# Patient Record
Sex: Female | Born: 1960 | Hispanic: No | Marital: Married | Smoking: Never smoker
Health system: Southern US, Community
[De-identification: ages and names within clinical notes are randomized; demographics above are authoritative.]

## PROBLEM LIST (undated history)

## (undated) DIAGNOSIS — E119 Type 2 diabetes mellitus without complications: Secondary | ICD-10-CM

## (undated) DIAGNOSIS — H269 Unspecified cataract: Secondary | ICD-10-CM

## (undated) DIAGNOSIS — R87619 Unspecified abnormal cytological findings in specimens from cervix uteri: Secondary | ICD-10-CM

## (undated) HISTORY — DX: Unspecified abnormal cytological findings in specimens from cervix uteri: R87.619

## (undated) HISTORY — PX: EYE SURGERY: SHX253

## (undated) HISTORY — DX: Type 2 diabetes mellitus without complications: E11.9

## (undated) HISTORY — DX: Unspecified cataract: H26.9

---

## 2010-12-09 ENCOUNTER — Other Ambulatory Visit: Payer: Self-pay | Admitting: Internal Medicine

## 2010-12-09 ENCOUNTER — Ambulatory Visit: Payer: Self-pay | Admitting: Internal Medicine

## 2010-12-09 LAB — COMPREHENSIVE METABOLIC PANEL
ALT: 45 U/L — ABNORMAL HIGH (ref 0–35)
AST: 27 U/L (ref 0–37)
Albumin: 4.9 g/dL (ref 3.5–5.2)
Alkaline Phosphatase: 100 U/L (ref 39–117)
BUN: 13 mg/dL (ref 6–23)
Creatinine, Ser: 0.72 mg/dL (ref 0.40–1.20)
Potassium: 3.8 mEq/L (ref 3.5–5.3)

## 2010-12-09 LAB — CBC WITH DIFFERENTIAL/PLATELET
BASO%: 0.5 % (ref 0.0–2.0)
MCHC: 35.1 g/dL (ref 31.5–36.0)
MONO#: 0.5 10*3/uL (ref 0.1–0.9)
RBC: 4.66 10*6/uL (ref 3.70–5.45)
WBC: 5.2 10*3/uL (ref 3.9–10.3)
lymph#: 1.5 10*3/uL (ref 0.9–3.3)

## 2010-12-09 LAB — IRON AND TIBC
%SAT: 33 % (ref 20–55)
Iron: 117 ug/dL (ref 42–145)
UIBC: 233 ug/dL

## 2012-12-14 ENCOUNTER — Encounter: Payer: BC Managed Care – PPO | Attending: Family Medicine | Admitting: *Deleted

## 2012-12-14 ENCOUNTER — Encounter: Payer: Self-pay | Admitting: *Deleted

## 2012-12-14 DIAGNOSIS — Z713 Dietary counseling and surveillance: Secondary | ICD-10-CM | POA: Insufficient documentation

## 2012-12-14 DIAGNOSIS — E119 Type 2 diabetes mellitus without complications: Secondary | ICD-10-CM | POA: Insufficient documentation

## 2012-12-14 NOTE — Patient Instructions (Signed)
Plan:  Aim for 2-3 Carb Choices per meal (30-45 grams)  Aim for 0-1 Carbs per snack if hungry  Consider reading food labels for Total Carbohydrate of foods Continue with your current activity level by going to gym or playing outside for 30 minutes daily as tolerated Continue checking BG at alternate times per day as directed by MD

## 2012-12-14 NOTE — Progress Notes (Signed)
  Medical Nutrition Therapy:  Appt start time: 1530 end time:  1630.  Assessment:  Primary concerns today: patient here for newly diagnosed diabetes. 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 Lives with husband, both shop and she cooks most meals. Strong family history of Diabetes. SMBG now 4 times a day, range from 95-130. Going to gym 5-6 times a week now since diagnosed. Very sad about diagnosis, doesn't want the diabetes to monopolize her life.  MEDICATIONS: see list. Invokana   DIETARY INTAKE:  Usual eating pattern includes 3 meals and 2-3 snacks per day.  Everyday foods include good variety of all food groups.  Avoided foods include fried or high calorie foods now.    24-hr recall:  B ( AM): coffee and toast OR smoothie with fruit and protein powder, almond milk OR 2 toast, egg, coffee with non dairy creamer Snk ( AM): none  L (10 APM): carrots, yogurt and fresh fruit Snk (s PM): tuna on a tortilla, sprouts and lettuce D ( PM): lean meat, no starch or small portion lately, vegetables, water Snk ( PM): 3 crackers with natural PNB,  Beverages: coffee, water  Usual physical activity: going to gym now 5-6 times a week. Had stopped after daughter was married this past summer.  Estimated energy needs: 1200 calories 135 g carbohydrates 90 g protein 33 g fat  Progress Towards Goal(s):  In progress.   Nutritional Diagnosis:  NB-1.1 Food and nutrition-related knowledge deficit As related to new diagnosis of diabetes.  As evidenced by A1c of 11.2% when diagnosed in early December, 2013.    Intervention:  Nutrition counseling and diabetes education initiated. Discussed basic physiology of diabetes, SMBG and rationale of checking BG at alternate times of day, A1c, Carb Counting and reading food labels, and benefits of increased activity. Also spent time explaining the diagnosis was not her fault and that she has nothing to be ashamed of. Shared stories of previous patients who  started taking better care of themselves once they were diagnosed and ended up living much better in the long run. Plan:  Aim for 2-3 Carb Choices per meal (30-45 grams)  Aim for 0-1 Carbs per snack if hungry  Consider reading food labels for Total Carbohydrate of foods Continue with your current activity level by going to gym or playing outside for 30 minutes daily as tolerated Continue checking BG at alternate times per day as directed by MD  Handouts given during visit include: Living Well with Diabetes Carb Counting and Food Label handouts Meal Plan Card  Diabetes Medication List  Monitoring/Evaluation:  Dietary intake, exercise, reading food labels, and body weight in 6 week(s).

## 2013-01-27 ENCOUNTER — Ambulatory Visit: Payer: BC Managed Care – PPO | Admitting: *Deleted

## 2013-09-13 ENCOUNTER — Encounter (INDEPENDENT_AMBULATORY_CARE_PROVIDER_SITE_OTHER): Payer: BC Managed Care – PPO | Admitting: Ophthalmology

## 2013-09-13 DIAGNOSIS — H2510 Age-related nuclear cataract, unspecified eye: Secondary | ICD-10-CM

## 2013-09-13 DIAGNOSIS — H43819 Vitreous degeneration, unspecified eye: Secondary | ICD-10-CM

## 2013-09-13 DIAGNOSIS — E11319 Type 2 diabetes mellitus with unspecified diabetic retinopathy without macular edema: Secondary | ICD-10-CM

## 2013-09-13 DIAGNOSIS — E1139 Type 2 diabetes mellitus with other diabetic ophthalmic complication: Secondary | ICD-10-CM

## 2013-09-14 ENCOUNTER — Encounter (INDEPENDENT_AMBULATORY_CARE_PROVIDER_SITE_OTHER): Payer: Self-pay | Admitting: Ophthalmology

## 2014-01-02 ENCOUNTER — Encounter (INDEPENDENT_AMBULATORY_CARE_PROVIDER_SITE_OTHER): Payer: BC Managed Care – PPO | Admitting: Ophthalmology

## 2014-01-02 DIAGNOSIS — H43819 Vitreous degeneration, unspecified eye: Secondary | ICD-10-CM

## 2014-01-02 DIAGNOSIS — H35439 Paving stone degeneration of retina, unspecified eye: Secondary | ICD-10-CM

## 2014-01-02 DIAGNOSIS — E1165 Type 2 diabetes mellitus with hyperglycemia: Secondary | ICD-10-CM

## 2014-01-02 DIAGNOSIS — E11319 Type 2 diabetes mellitus with unspecified diabetic retinopathy without macular edema: Secondary | ICD-10-CM

## 2014-01-02 DIAGNOSIS — E1139 Type 2 diabetes mellitus with other diabetic ophthalmic complication: Secondary | ICD-10-CM

## 2014-01-09 ENCOUNTER — Encounter: Payer: Self-pay | Admitting: Obstetrics & Gynecology

## 2014-01-09 ENCOUNTER — Ambulatory Visit (INDEPENDENT_AMBULATORY_CARE_PROVIDER_SITE_OTHER): Payer: BC Managed Care – PPO | Admitting: Obstetrics & Gynecology

## 2014-01-09 VITALS — BP 156/96 | HR 101 | Temp 98.5°F | Ht 63.0 in | Wt 151.7 lb

## 2014-01-09 DIAGNOSIS — Z0000 Encounter for general adult medical examination without abnormal findings: Secondary | ICD-10-CM

## 2014-01-09 DIAGNOSIS — N952 Postmenopausal atrophic vaginitis: Secondary | ICD-10-CM

## 2014-01-09 MED ORDER — ESTRADIOL 10 MCG VA TABS: 1.0000 | ORAL_TABLET | Freq: Every day | VAGINAL | Status: DC

## 2014-01-09 NOTE — Progress Notes (Signed)
   Subjective:    Patient ID: Sharon Lozano, female    DOB: 05-Aug-1961, 53 y.o.   MRN: [id number]  HPI This 53 yo MW G3 P3 is here today with the complaint of dyspareunia due to vaginal atrophy. She had sudden cessation of her period at age 53. She reports only an occasional hot flash.   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    Objective:   Physical Exam  Moderate to severe atrophy Normal bimanual exam      Assessment & Plan:  Preventative care- pap done today, schedule mammogram Vaginal atrophy- vagifem tablets, astroglide prn

## 2014-02-14 ENCOUNTER — Other Ambulatory Visit: Payer: Self-pay | Admitting: Obstetrics & Gynecology

## 2014-02-14 ENCOUNTER — Ambulatory Visit (HOSPITAL_COMMUNITY)
Admission: RE | Admit: 2014-02-14 | Discharge: 2014-02-14 | Disposition: A | Discharge status: Home / Self Care | Payer: BC Managed Care – PPO | Source: Ambulatory Visit | Attending: Obstetrics & Gynecology | Admitting: Obstetrics & Gynecology

## 2014-02-14 DIAGNOSIS — Z0000 Encounter for general adult medical examination without abnormal findings: Secondary | ICD-10-CM

## 2014-02-14 DIAGNOSIS — Z1231 Encounter for screening mammogram for malignant neoplasm of breast: Secondary | ICD-10-CM

## 2015-01-31 ENCOUNTER — Other Ambulatory Visit: Payer: Self-pay | Admitting: Obstetrics & Gynecology

## 2016-06-09 ENCOUNTER — Other Ambulatory Visit: Payer: Self-pay | Admitting: Obstetrics & Gynecology

## 2016-09-23 ENCOUNTER — Ambulatory Visit: Payer: BC Managed Care – PPO | Admitting: Obstetrics & Gynecology

## 2016-10-29 ENCOUNTER — Ambulatory Visit (INDEPENDENT_AMBULATORY_CARE_PROVIDER_SITE_OTHER): Payer: BC Managed Care – PPO | Admitting: Obstetrics & Gynecology

## 2016-10-29 ENCOUNTER — Encounter: Payer: Self-pay | Admitting: Obstetrics & Gynecology

## 2016-10-29 VITALS — BP 139/85 | HR 88 | Ht 62.0 in | Wt 145.0 lb

## 2016-10-29 DIAGNOSIS — Z124 Encounter for screening for malignant neoplasm of cervix: Secondary | ICD-10-CM

## 2016-10-29 DIAGNOSIS — Z1151 Encounter for screening for human papillomavirus (HPV): Secondary | ICD-10-CM | POA: Diagnosis not present

## 2016-10-29 DIAGNOSIS — Z01419 Encounter for gynecological examination (general) (routine) without abnormal findings: Secondary | ICD-10-CM | POA: Diagnosis not present

## 2016-10-29 DIAGNOSIS — Z0000 Encounter for general adult medical examination without abnormal findings: Secondary | ICD-10-CM

## 2016-10-29 MED ORDER — ESTRADIOL 10 MCG VA TABS: ORAL_TABLET | VAGINAL | 16 refills | Status: DC

## 2016-10-29 NOTE — Progress Notes (Signed)
Subjective:    Mar DaringSusan P Gittins is a 55 y.o. MW P3 (31, 6626, and 57 yo kids, no grands) female who presents for an annual exam. The patient has no complaints today. She needs a refill of her Vagifem. The patient is sexually active. GYN screening history: last pap: was normal. The patient wears seatbelts: yes. The patient participates in regular exercise: yes. Has the patient ever been transfused or tattooed?: no. The patient reports that there is not domestic violence in her life.   Menstrual History: OB History    No data available      Menarche age: [age] No LMP recorded. Patient is postmenopausal.    The following portions of the patient's history were reviewed and updated as appropriate: allergies, current medications, past family history, past medical history, past social history, past surgical history and problem list.  Review of Systems Pertinent items are noted in HPI.   Married for 36 years Needs mammogram Had colonoscopy 2017 Had flu vaccine this seaon FH- no breast/gyn/colon cancer [facility] [place]   Objective:    BP 139/85   Pulse 88   Ht 5\' 2"  (1.575 m)   Wt 145 lb (65.8 kg)   BMI 26.52 kg/m   General Appearance:    Alert, cooperative, no distress, appears stated age  Head:    Normocephalic, without obvious abnormality, atraumatic  Eyes:    PERRL, conjunctiva/corneas clear, EOM's intact, fundi    benign, both eyes  Ears:    Normal TM's and external ear canals, both ears  Nose:   Nares normal, septum midline, mucosa normal, no drainage    or sinus tenderness  Throat:   Lips, mucosa, and tongue normal; teeth and gums normal  Neck:   Supple, symmetrical, trachea midline, no adenopathy;    thyroid:  no enlargement/tenderness/nodules; no carotid   bruit or JVD  Back:     Symmetric, no curvature, ROM normal, no CVA tenderness  Lungs:     Clear to auscultation bilaterally, respirations unlabored  Chest Wall:    No tenderness or deformity   Heart:    Regular  rate and rhythm, S1 and S2 normal, no murmur, rub   or gallop  Breast Exam:    No tenderness, masses, or nipple abnormality  Abdomen:     Soft, non-tender, bowel sounds active all four quadrants,    no masses, no organomegaly  Genitalia:    Normal female without lesion, discharge or tenderness     Extremities:   Extremities normal, atraumatic, no cyanosis or edema  Pulses:   2+ and symmetric all extremities  Skin:   Skin color, texture, turgor normal, no rashes or lesions  Lymph nodes:   Cervical, supraclavicular, and axillary nodes normal  Neurologic:   CNII-XII intact, normal strength, sensation and reflexes    throughout   .    Assessment:    Healthy female exam.    Plan:     Thin prep Pap smear. with cotesting Mammogram Refill of vagifem She may have refills forever.

## 2016-10-30 LAB — CYTOLOGY - PAP
Diagnosis: NEGATIVE
HPV: NOT DETECTED

## 2016-12-12 ENCOUNTER — Other Ambulatory Visit: Payer: Self-pay | Admitting: Obstetrics & Gynecology

## 2017-11-02 ENCOUNTER — Other Ambulatory Visit: Payer: Self-pay | Admitting: Obstetrics & Gynecology

## 2019-04-27 ENCOUNTER — Other Ambulatory Visit: Payer: Self-pay | Admitting: Nurse Practitioner

## 2019-04-27 DIAGNOSIS — Z1231 Encounter for screening mammogram for malignant neoplasm of breast: Secondary | ICD-10-CM

## 2019-05-23 ENCOUNTER — Other Ambulatory Visit: Payer: Self-pay

## 2019-05-23 ENCOUNTER — Ambulatory Visit
Admission: RE | Admit: 2019-05-23 | Discharge: 2019-05-23 | Disposition: A | Discharge status: Home / Self Care | Payer: BC Managed Care – PPO | Source: Ambulatory Visit | Attending: Nurse Practitioner | Admitting: Nurse Practitioner

## 2019-05-23 DIAGNOSIS — Z1231 Encounter for screening mammogram for malignant neoplasm of breast: Secondary | ICD-10-CM

## 2021-08-26 ENCOUNTER — Other Ambulatory Visit: Payer: Self-pay | Admitting: Nurse Practitioner

## 2021-08-26 DIAGNOSIS — Z1231 Encounter for screening mammogram for malignant neoplasm of breast: Secondary | ICD-10-CM

## 2021-09-24 ENCOUNTER — Other Ambulatory Visit: Payer: Self-pay

## 2021-09-24 ENCOUNTER — Ambulatory Visit
Admission: RE | Admit: 2021-09-24 | Discharge: 2021-09-24 | Disposition: A | Discharge status: Home / Self Care | Payer: BC Managed Care – PPO | Source: Ambulatory Visit | Attending: Nurse Practitioner | Admitting: Nurse Practitioner

## 2021-09-24 DIAGNOSIS — Z1231 Encounter for screening mammogram for malignant neoplasm of breast: Secondary | ICD-10-CM

## 2022-03-24 ENCOUNTER — Other Ambulatory Visit: Payer: Self-pay | Admitting: Nurse Practitioner

## 2022-03-24 DIAGNOSIS — R5381 Other malaise: Secondary | ICD-10-CM

## 2022-04-14 ENCOUNTER — Other Ambulatory Visit: Payer: Self-pay | Admitting: Nurse Practitioner

## 2022-04-14 DIAGNOSIS — Z780 Asymptomatic menopausal state: Secondary | ICD-10-CM

## 2022-04-14 DIAGNOSIS — Z8262 Family history of osteoporosis: Secondary | ICD-10-CM

## 2022-04-30 ENCOUNTER — Ambulatory Visit
Admission: RE | Admit: 2022-04-30 | Discharge: 2022-04-30 | Disposition: A | Discharge status: Home / Self Care | Payer: BC Managed Care – PPO | Source: Ambulatory Visit | Attending: Nurse Practitioner | Admitting: Nurse Practitioner

## 2022-04-30 DIAGNOSIS — Z780 Asymptomatic menopausal state: Secondary | ICD-10-CM

## 2022-04-30 DIAGNOSIS — Z8262 Family history of osteoporosis: Secondary | ICD-10-CM

## 2022-08-30 IMAGING — MG MM DIGITAL SCREENING BILAT W/ TOMO AND CAD
8 series · 9 of 24 positions shown · non-contrast
Comparison: Previous exam(s).

CLINICAL DATA: Screening.

EXAM:
DIGITAL SCREENING BILATERAL MAMMOGRAM WITH TOMOSYNTHESIS AND CAD
TECHNIQUE: Bilateral screening digital craniocaudal and mediolateral oblique
mammograms were obtained. Bilateral screening digital breast
tomosynthesis was performed. The images were evaluated with
computer-aided detection.

[R MLO synth-2D]
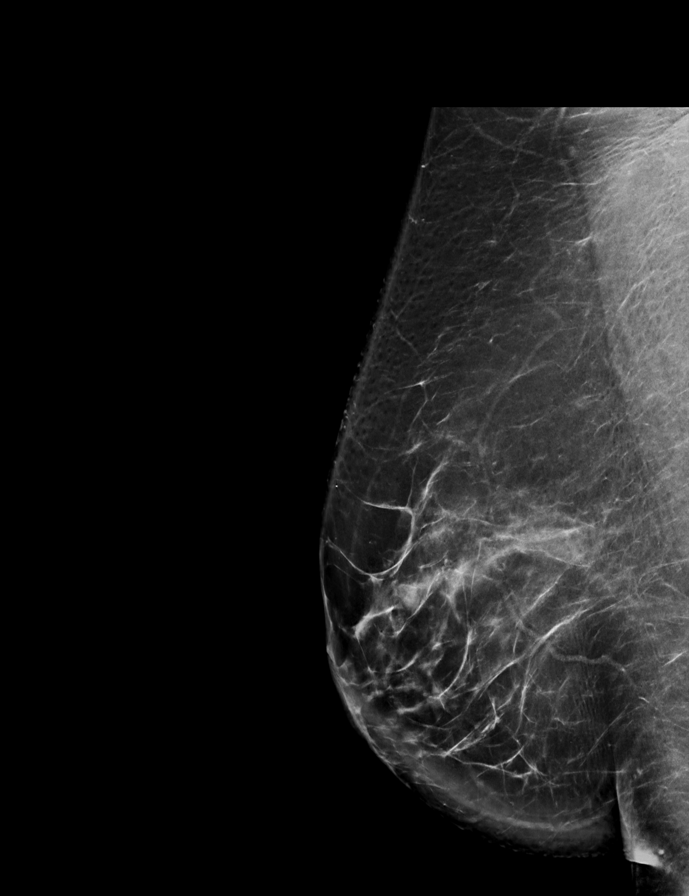

[L CC synth-2D]
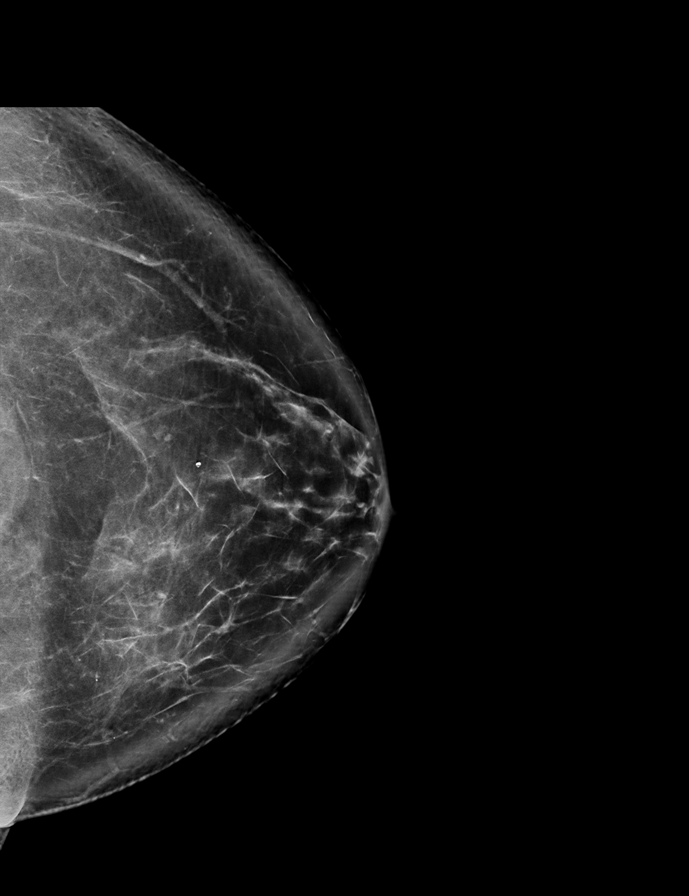

[L MLO synth-2D]
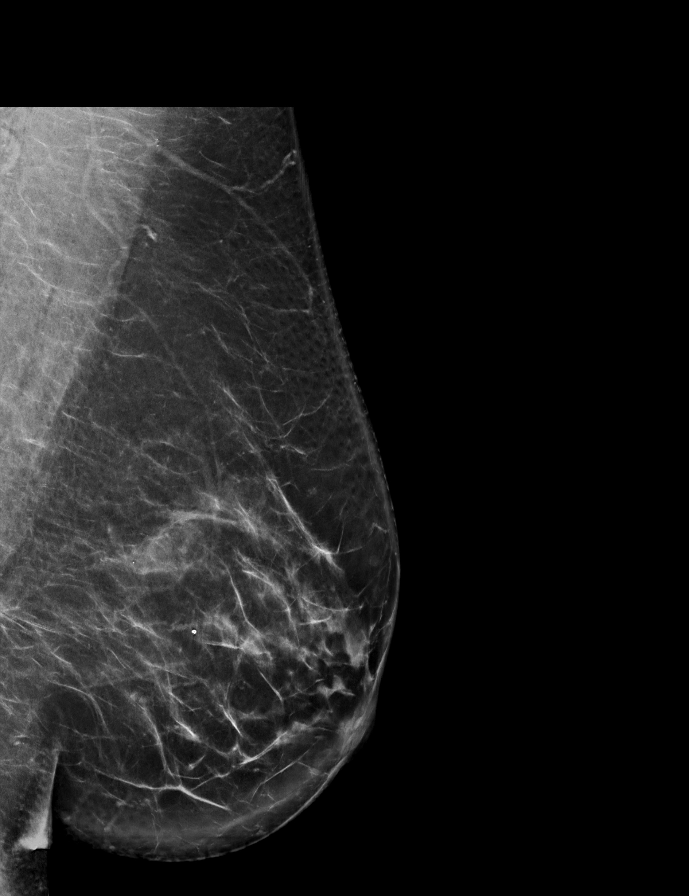

[R CC synth-2D]
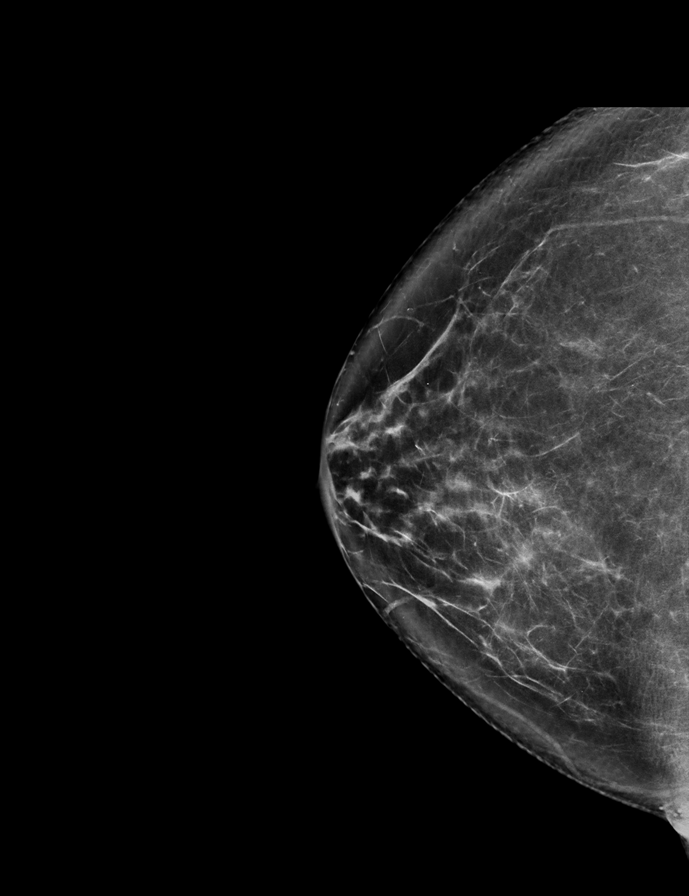

[L CC tomo · 2 of 90 frames shown]
[frame 29/90]
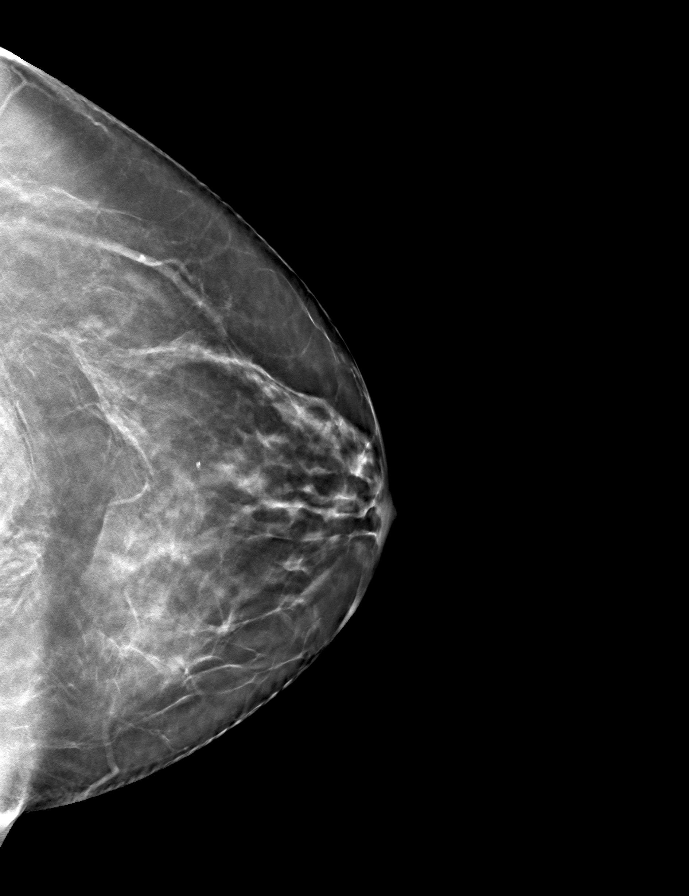
[frame 45/90]
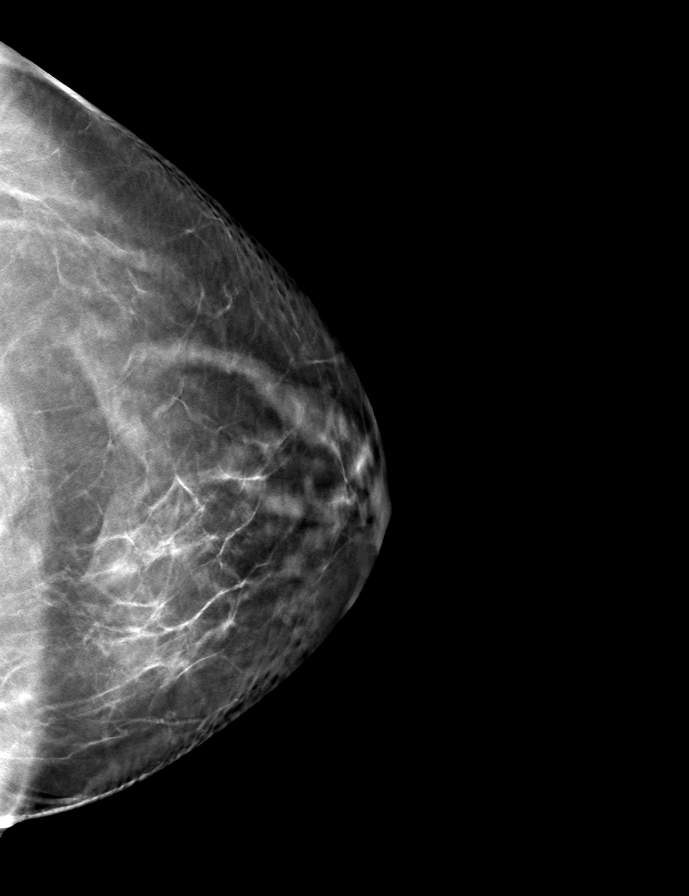

[R MLO tomo · tomo slice 47/93.0]
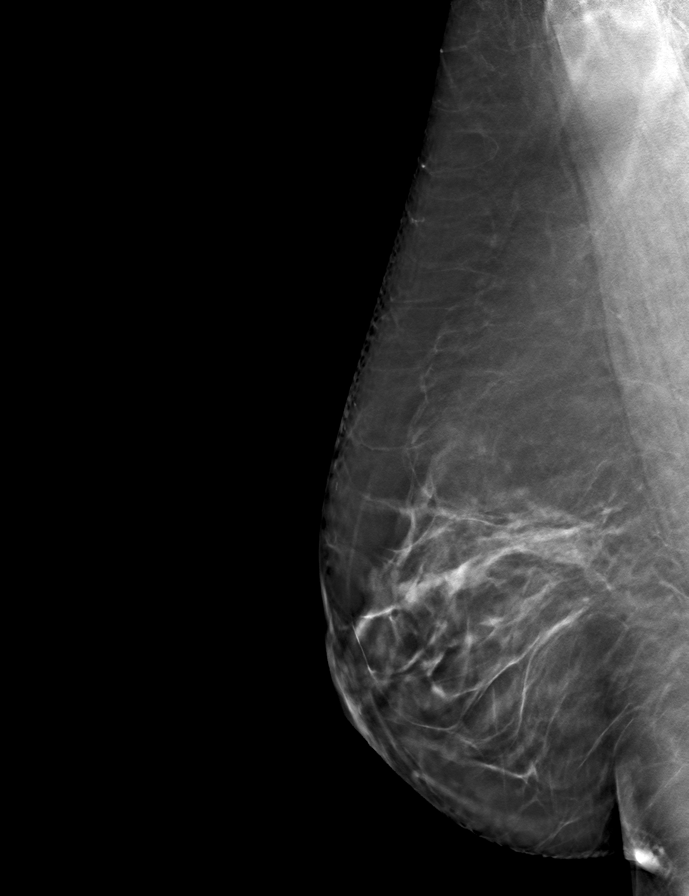

[R CC tomo · tomo slice 41/80.0]
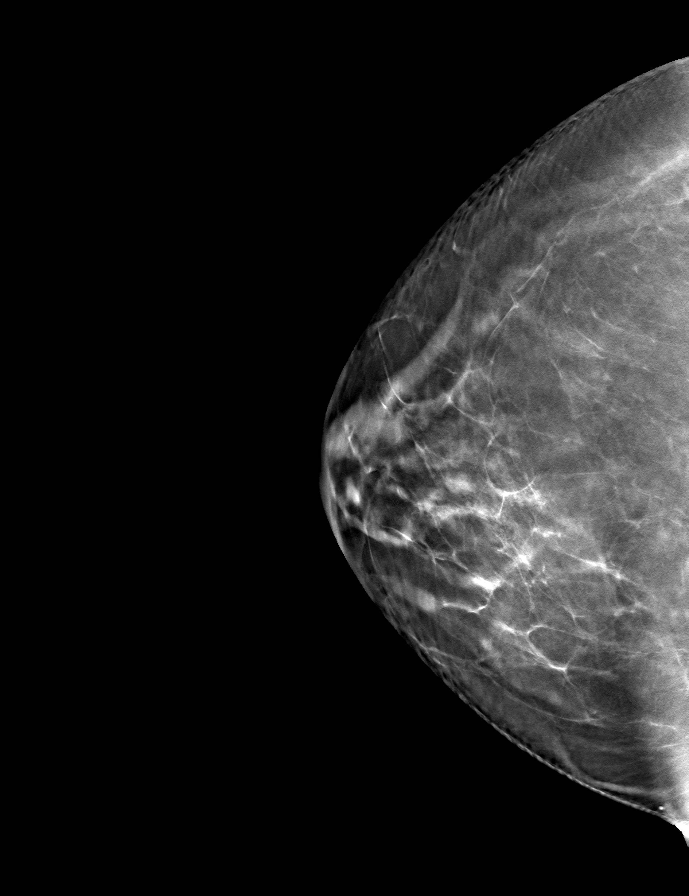

[L MLO tomo · tomo slice 43/85.0]
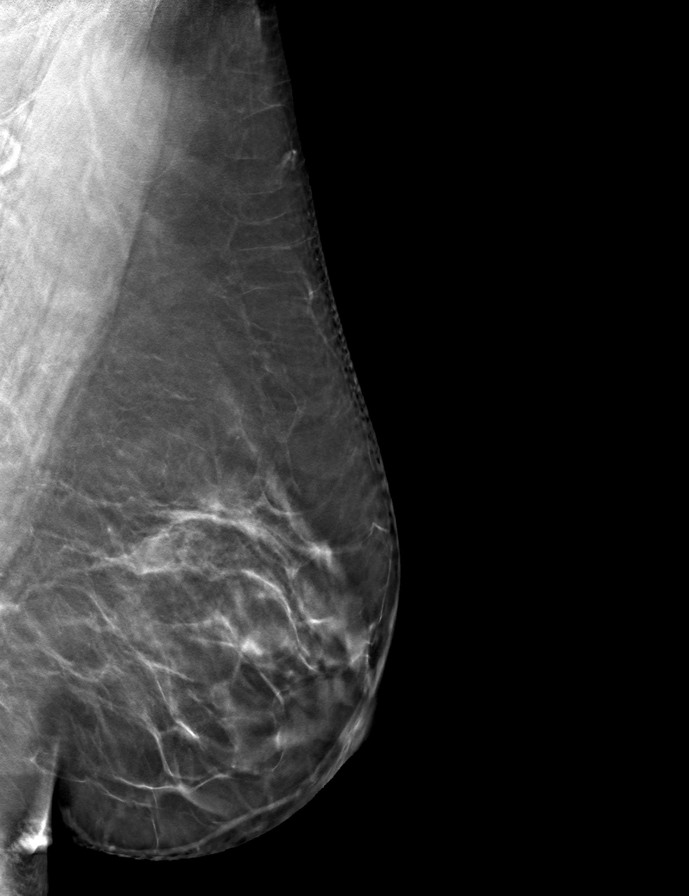

[9 of 24 positions shown; findings below may reference images not displayed]

ACR Breast Density Category b: There are scattered areas of
fibroglandular density.
FINDINGS: There are no findings suspicious for malignancy.
IMPRESSION: No mammographic evidence of malignancy. A result letter of this
screening mammogram will be mailed directly to the patient.

RECOMMENDATION:
Screening mammogram in one year. (Code:51-O-LD2)

BI-RADS CATEGORY  1: Negative.
